# Patient Record
Sex: Male | Born: 2000 | Race: White | Hispanic: No | Marital: Single | Smoking: Never smoker
Health system: Southern US, Community
[De-identification: ages and names within clinical notes are randomized; demographics above are authoritative.]

---

## 2020-02-03 ENCOUNTER — Encounter (HOSPITAL_COMMUNITY): Payer: Self-pay

## 2020-02-03 ENCOUNTER — Other Ambulatory Visit: Payer: Self-pay

## 2020-02-03 DIAGNOSIS — X501XXA Overexertion from prolonged static or awkward postures, initial encounter: Secondary | ICD-10-CM | POA: Insufficient documentation

## 2020-02-03 DIAGNOSIS — Y999 Unspecified external cause status: Secondary | ICD-10-CM | POA: Diagnosis not present

## 2020-02-03 DIAGNOSIS — Y9389 Activity, other specified: Secondary | ICD-10-CM | POA: Diagnosis not present

## 2020-02-03 DIAGNOSIS — S82851A Displaced trimalleolar fracture of right lower leg, initial encounter for closed fracture: Secondary | ICD-10-CM | POA: Insufficient documentation

## 2020-02-03 DIAGNOSIS — Y929 Unspecified place or not applicable: Secondary | ICD-10-CM | POA: Insufficient documentation

## 2020-02-03 DIAGNOSIS — S99911A Unspecified injury of right ankle, initial encounter: Secondary | ICD-10-CM | POA: Diagnosis present

## 2020-02-03 MED ORDER — ONDANSETRON HCL 4 MG/2ML IJ SOLN
4.0000 mg | Freq: Once | INTRAMUSCULAR | Status: AC
  Administered 2020-02-04: 4 mg via INTRAVENOUS
  Filled 2020-02-03: qty 2

## 2020-02-03 NOTE — ED Triage Notes (Signed)
Patient fell off skateboard injuring right ankle. Obvious deformity noted

## 2020-02-04 ENCOUNTER — Telehealth (HOSPITAL_COMMUNITY): Payer: Self-pay | Admitting: Emergency Medicine

## 2020-02-04 ENCOUNTER — Emergency Department (HOSPITAL_COMMUNITY)
Admission: EM | Admit: 2020-02-04 | Discharge: 2020-02-04 | Disposition: A | Discharge status: Home / Self Care | Payer: Managed Care, Other (non HMO) | Attending: Emergency Medicine | Admitting: Emergency Medicine

## 2020-02-04 ENCOUNTER — Emergency Department (HOSPITAL_COMMUNITY): Payer: Managed Care, Other (non HMO)

## 2020-02-04 DIAGNOSIS — S82851A Displaced trimalleolar fracture of right lower leg, initial encounter for closed fracture: Secondary | ICD-10-CM

## 2020-02-04 DIAGNOSIS — M25579 Pain in unspecified ankle and joints of unspecified foot: Secondary | ICD-10-CM

## 2020-02-04 MED ORDER — HYDROCODONE-ACETAMINOPHEN 5-325 MG PO TABS: 1.0000 | ORAL_TABLET | ORAL | 0 refills | Status: DC | PRN

## 2020-02-04 MED ORDER — FENTANYL CITRATE (PF) 100 MCG/2ML IJ SOLN
100.0000 ug | Freq: Once | INTRAMUSCULAR | Status: AC
  Administered 2020-02-04: 100 ug via INTRAVENOUS
  Filled 2020-02-04: qty 2

## 2020-02-04 MED ORDER — HYDROCODONE-ACETAMINOPHEN 5-325 MG PO TABS: 1.0000 | ORAL_TABLET | Freq: Once | ORAL | Status: DC

## 2020-02-04 NOTE — Discharge Instructions (Addendum)
Apply ice for thirty minutes at a time, four times a day.  Keep our foot elevated as much as possible.  No weight bearing until cleared to do so by your orthopedic doctor.  Take ibuprofen or acetaminophen as needed for pain. Take hydrocodone-acetaminophen as needed for severe pain.

## 2020-02-04 NOTE — Progress Notes (Signed)
Orthopedic Tech Progress Note Patient Details:  Craig Clay [birth year] [id number]  Ortho Devices Type of Ortho Device: Ace wrap, Stirrup splint, Post (short leg) splint Ortho Device/Splint Location: rle. applied post reduction with drs assist. Ortho Device/Splint Interventions: Ordered, Application   Post Interventions Patient Tolerated: Well Instructions Provided: Care of device, Adjustment of device   Jennye Moccasin 02/04/2020, 12:43 PM

## 2020-02-04 NOTE — Progress Notes (Signed)
Orthopedic Tech Progress Note Patient Details:  Craig Clay April 22, 2001 [id number]  Ortho Devices Type of Ortho Device: Post (short leg) splint, Stirrup splint Ortho Device/Splint Location: rle. applied post reduction with drs assist. Ortho Device/Splint Interventions: Ordered, Application, Adjustment   Post Interventions Patient Tolerated: Well Instructions Provided: Care of device, Adjustment of device   Trinna Post 02/04/2020, 3:47 AM

## 2020-02-04 NOTE — Telephone Encounter (Signed)
Issue with prescription.  ReE-prescribing.

## 2020-02-04 NOTE — ED Provider Notes (Signed)
[facility]-EMERGENCY DEPT Provider Note   CSN: [id number] Arrival date & time: 02/03/20  2341   History Chief Complaint  Patient presents with  . Ankle Injury    Craig Clay is a 19 y.o. male.  The history is provided by the patient.  Ankle Injury  He has no significant past history and comes in after injuring his right ankle falling off of a skateboard.  He states that his ankle twisted.  He was brought in by ambulance and received fentanyl in the ambulance.  Pain is currently rated at 4/10.  Of note, he was not wearing a helmet but denies head injury.  History reviewed. No pertinent past medical history.  There are no problems to display for this patient.   History reviewed. No pertinent surgical history.     No family history on file.  Social History   Tobacco Use  . Smoking status: Never Smoker  . Smokeless tobacco: Never Used  Substance Use Topics  . Alcohol use: Not Currently  . Drug use: Never    Home Medications Prior to Admission medications   Not on File    Allergies    Patient has no known allergies.  Review of Systems   Review of Systems  All other systems reviewed and are negative.   Physical Exam Updated Vital Signs BP 130/77 (BP Location: Right Arm)   Pulse 77   Temp 99.2 F (37.3 C) (Oral)   Resp 18   Ht 6\' 3"  (1.905 m)   Wt 99.8 kg   SpO2 94%   BMI 27.50 kg/m   Physical Exam Vitals and nursing note reviewed.   19 year old male, resting comfortably and in no acute distress. Vital signs are normal. Oxygen saturation is 94%, which is normal. Head is normocephalic and atraumatic. PERRLA, EOMI. Oropharynx is clear. Neck is nontender and supple without adenopathy. Back is nontender and there is no CVA tenderness. Lungs are clear without rales, wheezes, or rhonchi. Chest is nontender. Heart has regular rate and rhythm without murmur. Abdomen is soft, flat, nontender without masses or hepatosplenomegaly and  peristalsis is normoactive. Extremities: Obvious deformity of the right ankle.  Small abrasion is seen anteriorly.  No other break in the skin identified.  Dorsalis pedis pulse is strong and capillary refill is prompt.  There is normal distal sensation. Skin is warm and dry without rash. Neurologic: Mental status is normal, cranial nerves are intact, there are no motor or sensory deficits.  ED Results / Procedures / Treatments   Labs (all labs ordered are listed, but only abnormal results are displayed) Labs Reviewed - No data to display  EKG None  Radiology DG Ankle 2 Views Right  Result Date: 02/04/2020 CLINICAL DATA:  Follow-up examination status post reduction. EXAM: RIGHT ANKLE - 2 VIEW COMPARISON:  Prior radiograph from earlier the same day. FINDINGS: Splinting material now seen overlying the right ankle. Previously seen fracture dislocation of the right ankle joint has been reduced, with the ankle now in gross anatomic alignment. Persistent slight lateral and posterior displacement at the distal fibular fracture. Minimal displacement about the medial malleolar and posterior malleolar fractures as well. Overlying soft tissue swelling. No new osseous abnormality. IMPRESSION: Interval reduction of previously seen right ankle fracture dislocation, now in gross anatomic alignment. Electronically Signed   By: 02/06/2020 M.D.   On: 02/04/2020 02:01   DG Ankle Complete Right  Result Date: 02/04/2020 CLINICAL DATA:  Skateboard injury EXAM: RIGHT ANKLE -  COMPLETE 3+ VIEW COMPARISON:  None. FINDINGS: Acute comminuted fracture involving the distal shaft of the fibula with close to 1 shaft diameter lateral and 1/2 shaft diameter posterior displacement of distal fracture fragment. There is mild posterior angulation of the distal fibular fracture fragment as well. Acute displaced posterior malleolar fracture. Dislocation at the ankle with the talar dome displaced posterior and lateral with  respect to the distal tibia. Possible nondisplaced fracture lucency at the medial malleolus. Positive for soft tissue swelling. IMPRESSION: 1. Acute displaced distal fibular fracture with possible nondisplaced medial malleolar fracture. 2. Acute displaced posterior malleolar fracture. Dislocation at the ankle with the talar dome displaced posterior and lateral with respect to the distal tibia. Electronically Signed   By: Jasmine Pang M.D.   On: 02/04/2020 00:26   CT Ankle Right Wo Contrast  Result Date: 02/04/2020 CLINICAL DATA:  19 year old male with right ankle fracture. EXAM: CT OF THE RIGHT ANKLE WITHOUT CONTRAST TECHNIQUE: Multidetector CT imaging of the right ankle was performed according to the standard protocol. Multiplanar CT image reconstructions were also generated. COMPARISON:  Earlier radiograph dated 02/04/2020. FINDINGS: Bones/Joint/Cartilage Mildly displaced fracture of the distal fibular diaphysis with mild posterolateral displacement of the distal fracture fragment. Minimally displaced fractures of the medial malleolus extending to the posterior malleolus and posterior aspect of the tibial plafond. No dislocation. The ankle mortise is intact. A cast is noted. Ligaments Suboptimally assessed by CT. Muscles and Tendons No acute intramuscular hematoma. Soft tissues Soft tissue swelling of the ankle. No large hematoma or fluid collection. IMPRESSION: 1. Mildly displaced fracture of the distal fibular diaphysis. 2. Minimally displaced fractures of the medial malleolus extending to the posterior malleolus and posterior aspect of the tibial plafond. Electronically Signed   By: Elgie Collard M.D.   On: 02/04/2020 02:31    Procedures .Ortho Injury Treatment  Date/Time: 02/04/2020 1:47 AM Performed by: Dione Booze, MD Authorized by: Dione Booze, MD   Consent:    Consent obtained:  Verbal   Consent given by:  Patient   Risks discussed:  Irreducible dislocation (pain)   Alternatives  discussed:  Alternative treatmentInjury location: ankle Location details: right ankle Injury type: fracture-dislocation Fracture type: bimalleolar Pre-procedure neurovascular assessment: neurovascularly intact Pre-procedure distal perfusion: normal Pre-procedure neurological function: normal  Anesthesia: Local anesthesia used: no  Patient sedated: NoManipulation performed: yes Skin traction used: no Skeletal traction used: no Reduction successful: yes X-ray confirmed reduction: yes Immobilization: splint Splint type: short leg (ankle stirrup plus posterior) Supplies used: Ortho-Glass and elastic bandage Post-procedure neurovascular assessment: post-procedure neurovascularly intact Post-procedure distal perfusion: normal Post-procedure neurological function: normal Patient tolerance: patient tolerated the procedure well with no immediate complications     Medications Ordered in ED Medications  HYDROcodone-acetaminophen (NORCO/VICODIN) 5-325 MG per tablet 1 tablet (has no administration in time range)  ondansetron (ZOFRAN) injection 4 mg (4 mg Intravenous Given 02/04/20 0004)  fentaNYL (SUBLIMAZE) injection 100 mcg (100 mcg Intravenous Given 02/04/20 0118)    ED Course  I have reviewed the triage vital signs and the nursing notes.  Pertinent labs & imaging results that were available during my care of the patient were reviewed by me and considered in my medical decision making (see chart for details).  MDM Rules/Calculators/A&P Right ankle injury.  X-ray shows displaced distal fibular fracture and possible nondisplaced medial malleolar fracture, displaced posterior malleolar fracture with dislocation of the ankle with the talar dome displaced posteriorly and laterally.  Abrasion noted on physical exam does not have any sharp  bone pieces underlying it, no concern for open fracture.  Will need to reduce the ankle and apply splint.  Old records are reviewed, and he has a prior  skateboarding accident with head injury.  Case is discussed with Dr. Linna Caprice, on-call for orthopedics.  He has reviewed the x-rays, and agrees with need for reduction and splint application with post reduction x-ray and requests CT scan be obtained following reduction.  Patient is to follow-up with Dr. Victorino Dike, foot and ankle specialist, in the next week.  Postreduction x-ray shows excellent reduction.  CT scan shows only minimal displacement of fracture fragments.  He is given prescription for hydrocodone-acetaminophen to use as needed, but advised to preferentially use over-the-counter pain medication.  Advised on ice and elevation, given crutches to use.  Final Clinical Impression(s) / ED Diagnoses Final diagnoses:  Closed displaced trimalleolar fracture of right ankle, initial encounter    Rx / DC Orders ED Discharge Orders         Ordered    HYDROcodone-acetaminophen (NORCO) 5-325 MG tablet  Every 4 hours PRN        02/04/20 0244           Dione Booze, MD 02/04/20 [phone]

## 2021-04-07 IMAGING — CT CT ANKLE*R* W/O CM
3 of 4 series · 13 of 34 positions shown, 15 images · non-contrast
Comparison: Earlier radiograph dated 02/04/2020.

CLINICAL DATA: 19-year-old male with right ankle fracture.

EXAM:
CT OF THE RIGHT ANKLE WITHOUT CONTRAST
TECHNIQUE: Multidetector CT imaging of the right ankle was performed according
to the standard protocol. Multiplanar CT image reconstructions were
also generated.

[Series 4: axial st · axial · 0.32mm/px · z∈[-160,-14]mm · 5 of 107 slices shown, 7 images]
[im 17/107  soft-tissue]
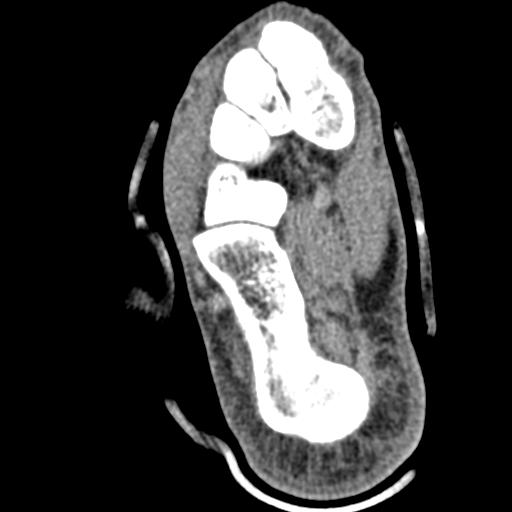
[im 17/107  bone]
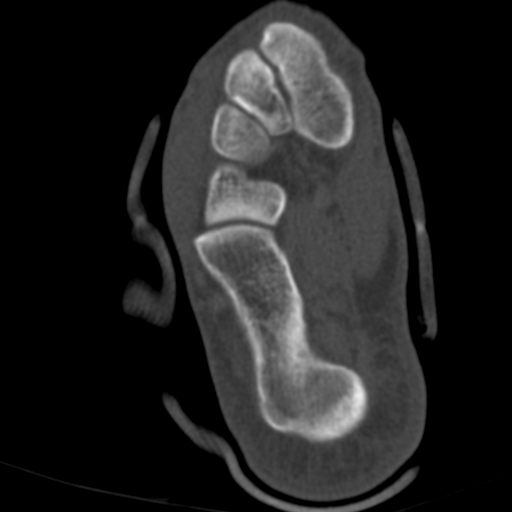
[im 33/107  bone]
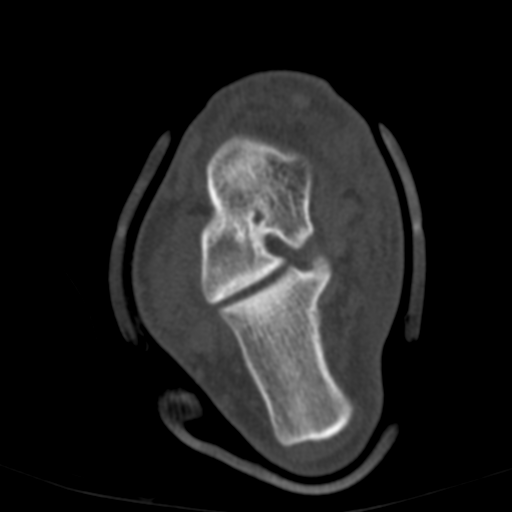
[im 58/107  bone]
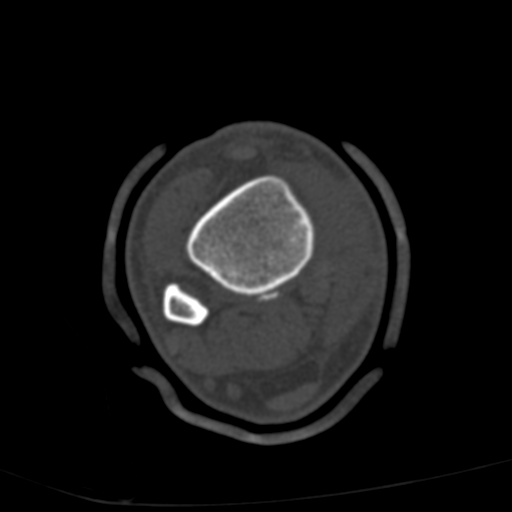
[im 74/107  bone]
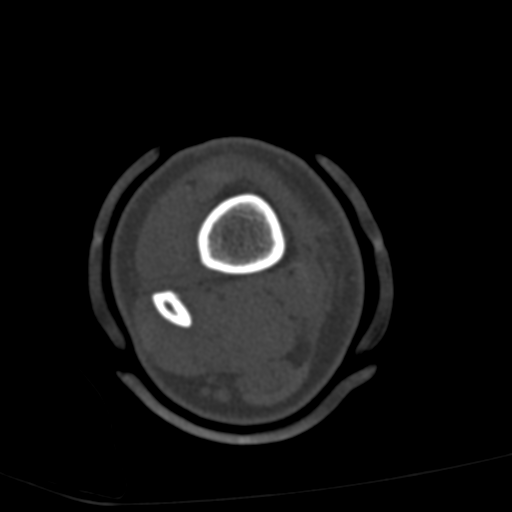
[im 90/107  soft-tissue]
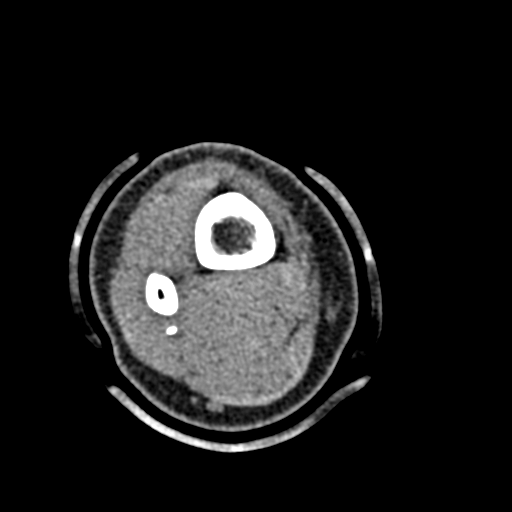
[im 90/107  bone]
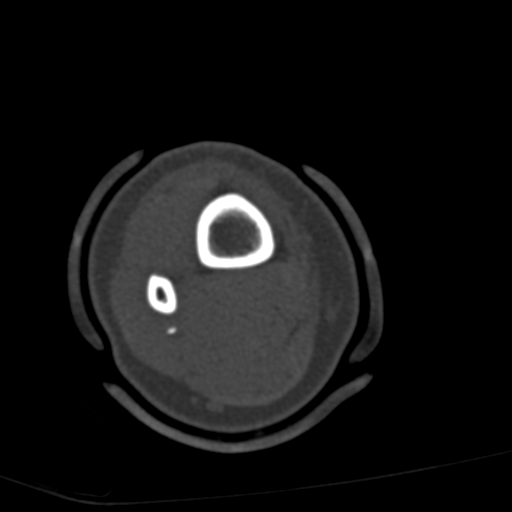

[Series 8: sagittal bone · sagittal · 0.30mm/px · 5 of 71 slices shown]
[im 12/71  bone]
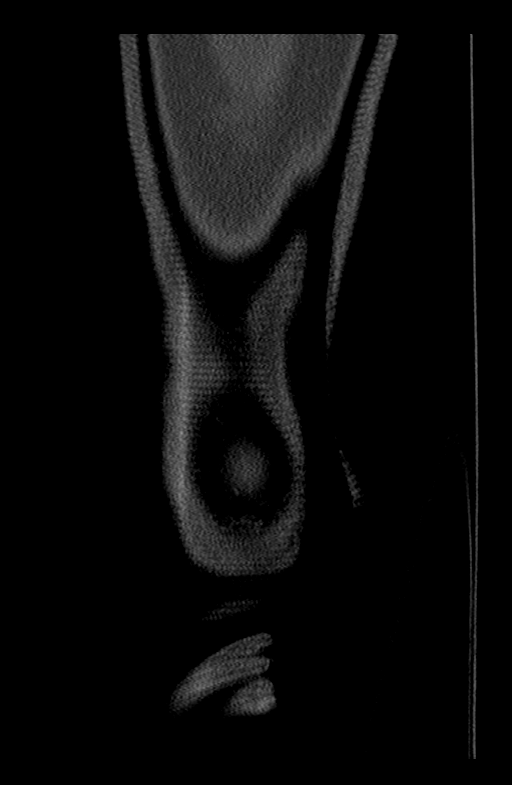
[im 24/71  bone]
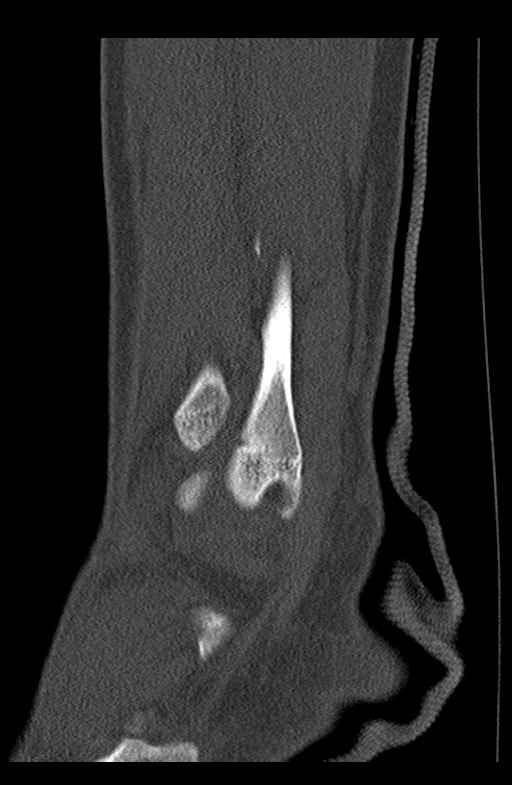
[im 36/71  bone]
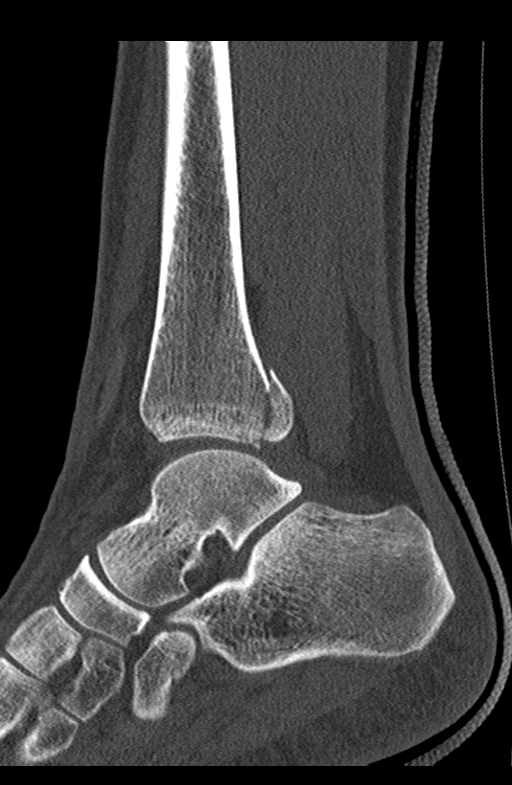
[im 47/71  bone]
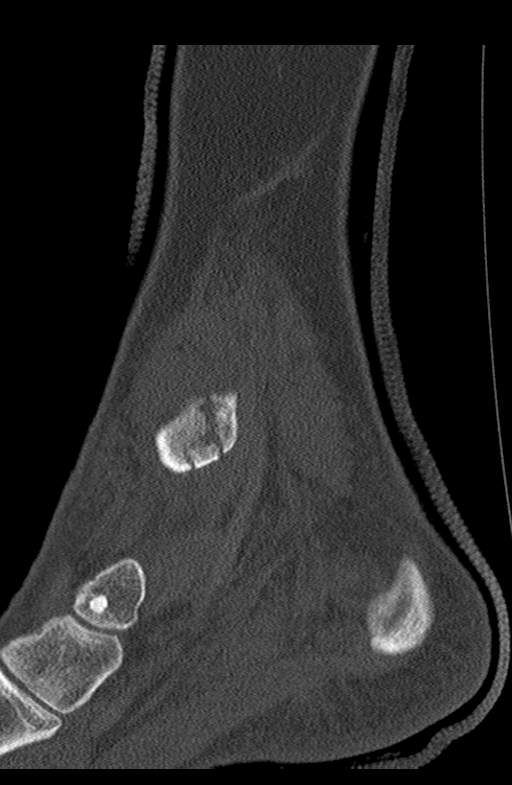
[im 59/71  bone]
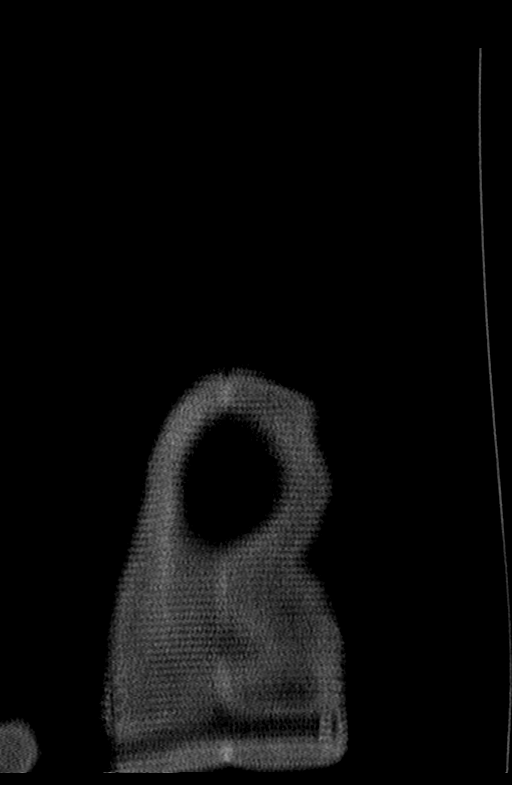

[Series 9: coronal st · coronal · 0.27mm/px · 3 of 78 slices shown]
[im 16/78  bone]
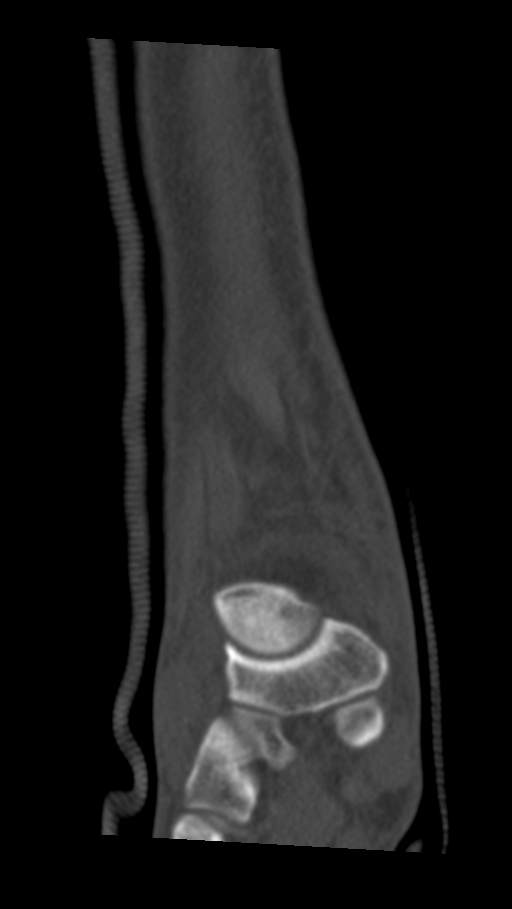
[im 31/78  bone]
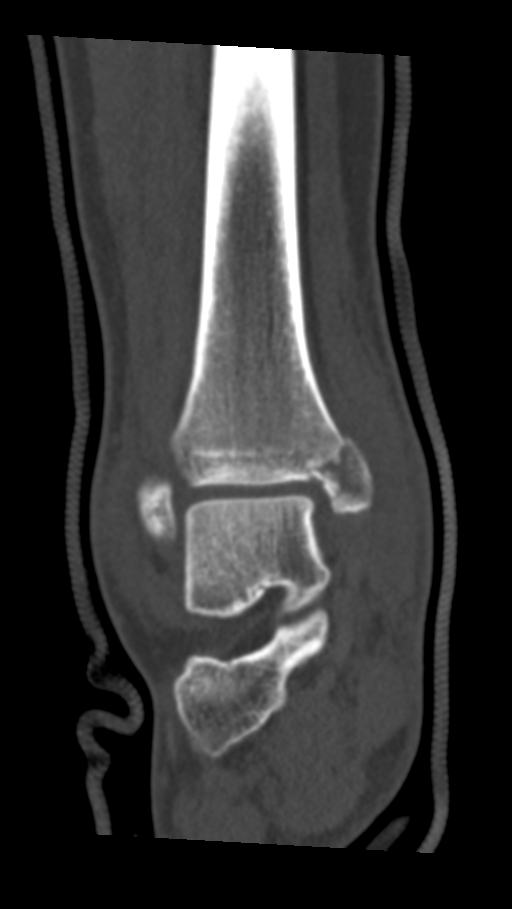
[im 47/78  bone]
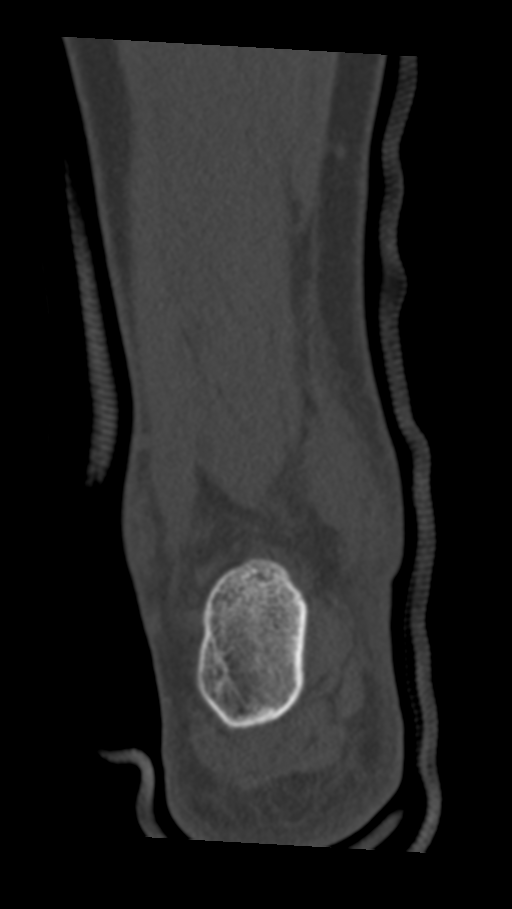

[13 of 34 positions shown; findings below may reference images not displayed]

FINDINGS: Bones/Joint/Cartilage

Mildly displaced fracture of the distal fibular diaphysis with mild
posterolateral displacement of the distal fracture fragment.
Minimally displaced fractures of the medial malleolus extending to
the posterior malleolus and posterior aspect of the tibial plafond.
No dislocation. The ankle mortise is intact. A cast is noted.

Ligaments

Suboptimally assessed by CT.

Muscles and Tendons

No acute intramuscular hematoma.

Soft tissues

Soft tissue swelling of the ankle. No large hematoma or fluid
collection.
IMPRESSION: 1. Mildly displaced fracture of the distal fibular diaphysis.
2. Minimally displaced fractures of the medial malleolus extending
to the posterior malleolus and posterior aspect of the tibial
plafond.

## 2022-08-11 ENCOUNTER — Ambulatory Visit
Admission: EM | Admit: 2022-08-11 | Discharge: 2022-08-11 | Disposition: A | Discharge status: Home / Self Care | Payer: Managed Care, Other (non HMO) | Attending: Nurse Practitioner | Admitting: Nurse Practitioner

## 2022-08-11 DIAGNOSIS — B349 Viral infection, unspecified: Secondary | ICD-10-CM | POA: Diagnosis not present

## 2022-08-11 DIAGNOSIS — R051 Acute cough: Secondary | ICD-10-CM

## 2022-08-11 DIAGNOSIS — U071 COVID-19: Secondary | ICD-10-CM | POA: Insufficient documentation

## 2022-08-11 NOTE — Discharge Instructions (Signed)
The clinic will contact you with results your COVID test is positivePlease treat your symptoms with over the counter cough medication, tylenol or ibuprofen, humidifier, and rest. Viral illnesses can last 7-14 days. Please follow up with your PCP if your symptoms are not improving. Please go to the ER for any worsening symptoms. This includes but is not limited to fever you can not control with tylenol or ibuprofen, you are not able to stay hydrated, you have shortness of breath or chest pain.  Thank you for choosing [facility] for your healthcare needs. I hope you feel better soon!

## 2022-08-11 NOTE — ED Provider Notes (Signed)
[facility]    CSN: [id number] Arrival date & time: 08/11/22  1055      History   Chief Complaint Chief Complaint  Patient presents with   Covid Positive    HPI Craig Clay is a 22 y.o. male  presents for evaluation of URI symptoms for 1 days. Patient reports associated symptoms of cough, congestion, sore throat, body aches, fevers of 100 degrees, ear fullness. Denies N/V/D, shortness of breath. Patient does not have a hx of asthma or smoking. No known sick contacts.  Pt has taken DayQuil OTC for symptoms.  He is vaccinated for COVID.  He reports a a positive home COVID test today.  He has not had COVID in the past.  Pt has no other concerns at this time.   HPI  History reviewed. No pertinent past medical history.  There are no problems to display for this patient.   History reviewed. No pertinent surgical history.     Home Medications    Prior to Admission medications   Medication Sig Start Date End Date Taking? Authorizing Provider  HYDROcodone-acetaminophen (NORCO) 5-325 MG tablet Take 1 tablet by mouth every 4 (four) hours as needed for moderate pain. 0000000   Delora Fuel, MD  HYDROcodone-acetaminophen (NORCO/VICODIN) 5-325 MG tablet Take 1 tablet by mouth every 4 (four) hours as needed. 02/04/20   Tedd Sias, PA    Family History History reviewed. No pertinent family history.  Social History Social History   Tobacco Use   Smoking status: Never   Smokeless tobacco: Never  Vaping Use   Vaping Use: Some days  Substance Use Topics   Alcohol use: Yes   Drug use: Not Currently     Allergies   Patient has no known allergies.   Review of Systems Review of Systems  Constitutional:  Positive for fever.  HENT:  Positive for congestion, ear pain and sore throat.   Respiratory:  Positive for cough.   Musculoskeletal:  Positive for myalgias.     Physical Exam Triage Vital Signs ED Triage Vitals  Enc Vitals Group     BP 08/11/22 1108  112/76     Pulse Rate 08/11/22 1108 86     Resp 08/11/22 1108 18     Temp 08/11/22 1108 98.1 F (36.7 C)     Temp Source 08/11/22 1108 Oral     SpO2 08/11/22 1108 95 %     Weight 08/11/22 1115 246 lb (111.6 kg)     Height 08/11/22 1123 '6\' 3"'$  (1.905 m)     Head Circumference --      Peak Flow --      Pain Score 08/11/22 1107 0     Pain Loc --      Pain Edu? --      Excl. in [place]? --    No data found.  Updated Vital Signs BP 112/76 (BP Location: Left Arm)   Pulse 86   Temp 98.1 F (36.7 C) (Oral)   Resp 18   Ht '6\' 3"'$  (1.905 m)   Wt 246 lb (111.6 kg)   SpO2 95%   BMI 30.75 kg/m   Visual Acuity Right Eye Distance:   Left Eye Distance:   Bilateral Distance:    Right Eye Near:   Left Eye Near:    Bilateral Near:     Physical Exam Vitals and nursing note reviewed.  Constitutional:      General: He is not in acute distress.    Appearance:  Normal appearance. He is not ill-appearing or toxic-appearing.  HENT:     Head: Normocephalic and atraumatic.     Right Ear: Tympanic membrane and ear canal normal.     Left Ear: Tympanic membrane and ear canal normal.     Nose: Congestion present.     Mouth/Throat:     Mouth: Mucous membranes are moist.     Pharynx: Posterior oropharyngeal erythema present.  Eyes:     Pupils: Pupils are equal, round, and reactive to light.  Cardiovascular:     Rate and Rhythm: Normal rate and regular rhythm.     Heart sounds: Normal heart sounds.  Pulmonary:     Effort: Pulmonary effort is normal.     Breath sounds: Normal breath sounds.  Musculoskeletal:     Cervical back: Normal range of motion and neck supple.  Lymphadenopathy:     Cervical: No cervical adenopathy.  Skin:    General: Skin is warm and dry.  Neurological:     General: No focal deficit present.     Mental Status: He is alert and oriented to person, place, and time.  Psychiatric:        Mood and Affect: Mood normal.        Behavior: Behavior normal.      UC  Treatments / Results  Labs (all labs ordered are listed, but only abnormal results are displayed) Labs Reviewed  SARS CORONAVIRUS 2 (TAT 6-24 HRS)    EKG   Radiology No results found.  Procedures Procedures (including critical care time)  Medications Ordered in UC Medications - No data to display  Initial Impression / Assessment and Plan / UC Course  I have reviewed the triage vital signs and the nursing notes.  Pertinent labs & imaging results that were available during my care of the patient were reviewed by me and considered in my medical decision making (see chart for details).     Will contact patient if COVID PCR is positive.  Patient reports positive home COVID test.  If his PCR is positive he does want to start Paxlovid.  He qualifies based on his BMI.  He does not take any medications on a daily basis prescribed or over-the-counter.  Denies any known kidney or liver function issues.  Discussed side effect profile of Paxlovid and he wishes to proceed.  If COVID PCR is positive Paxlovid will be sent to pharmacy Declined Rx cough medicine will use OTC cough medicine as needed Rest and fluids Continue OTC Tylenol or ibuprofen as needed for fever/body aches.  Reviewed new CDC guidelines for quarantine Follow-up with PCP if symptoms do not improve ER precautions reviewed and patient verbalized understanding Final Clinical Impressions(s) / UC Diagnoses   Final diagnoses:  Acute cough  Viral illness     Discharge Instructions      The clinic will contact you with results your COVID test is positivePlease treat your symptoms with over the counter cough medication, tylenol or ibuprofen, humidifier, and rest. Viral illnesses can last 7-14 days. Please follow up with your PCP if your symptoms are not improving. Please go to the ER for any worsening symptoms. This includes but is not limited to fever you can not control with tylenol or ibuprofen, you are not able to stay  hydrated, you have shortness of breath or chest pain.  Thank you for choosing [facility] for your healthcare needs. I hope you feel better soon!     ED Prescriptions   None  PDMP not reviewed this encounter.   Melynda Ripple, NP 08/11/22 1130

## 2022-08-11 NOTE — ED Triage Notes (Signed)
Pt states at home Covid test this morning was positive. The reports having lower grade fever, cough, sore throat, headache, and ear popping sensation that began yesterday.   Home interventions: dayquil

## 2022-08-12 ENCOUNTER — Telehealth (HOSPITAL_COMMUNITY): Payer: Self-pay | Admitting: Emergency Medicine

## 2022-08-12 ENCOUNTER — Telehealth: Payer: Self-pay

## 2022-08-12 LAB — SARS CORONAVIRUS 2 (TAT 6-24 HRS): SARS Coronavirus 2: POSITIVE — AB

## 2022-08-12 MED ORDER — NIRMATRELVIR/RITONAVIR (PAXLOVID)TABLET: 3.0000 | ORAL_TABLET | Freq: Two times a day (BID) | ORAL | 0 refills | Status: AC

## 2022-08-12 NOTE — Telephone Encounter (Signed)
Pt called but disconnected prior to transfer. Called pt back and LM to call back. Advised in message that the UC tried to reach out to him and to call them back. Advised to call back for further questions.
